# Patient Record
Sex: Female | Born: 1950 | Race: Black or African American | Hispanic: No | Marital: Single | Smoking: Current some day smoker
Health system: Southern US, Community
[De-identification: ages and names within clinical notes are randomized; demographics above are authoritative.]

## PROBLEM LIST (undated history)

## (undated) DIAGNOSIS — I10 Essential (primary) hypertension: Secondary | ICD-10-CM

## (undated) HISTORY — PX: APPENDECTOMY: SHX54

## (undated) HISTORY — PX: BREAST SURGERY: SHX581

---

## 2009-01-07 ENCOUNTER — Emergency Department (HOSPITAL_BASED_OUTPATIENT_CLINIC_OR_DEPARTMENT_OTHER): Admission: EM | Admit: 2009-01-07 | Discharge: 2009-01-07 | Payer: Self-pay | Admitting: Emergency Medicine

## 2009-04-12 ENCOUNTER — Ambulatory Visit: Payer: Self-pay | Admitting: Diagnostic Radiology

## 2009-04-12 ENCOUNTER — Emergency Department (HOSPITAL_BASED_OUTPATIENT_CLINIC_OR_DEPARTMENT_OTHER): Admission: EM | Admit: 2009-04-12 | Discharge: 2009-04-12 | Payer: Self-pay | Admitting: Emergency Medicine

## 2009-11-30 ENCOUNTER — Other Ambulatory Visit: Admission: RE | Admit: 2009-11-30 | Discharge: 2009-11-30 | Payer: Self-pay | Admitting: Obstetrics and Gynecology

## 2009-11-30 ENCOUNTER — Ambulatory Visit: Payer: Self-pay | Admitting: Obstetrics and Gynecology

## 2010-04-17 ENCOUNTER — Ambulatory Visit: Payer: Self-pay | Admitting: Diagnostic Radiology

## 2010-04-17 ENCOUNTER — Emergency Department (HOSPITAL_BASED_OUTPATIENT_CLINIC_OR_DEPARTMENT_OTHER): Admission: EM | Admit: 2010-04-17 | Discharge: 2010-04-17 | Payer: Self-pay | Admitting: Emergency Medicine

## 2010-09-28 LAB — DIFFERENTIAL
Eosinophils Absolute: 0.1 10*3/uL (ref 0.0–0.7)
Eosinophils Relative: 3 % (ref 0–5)
Lymphs Abs: 1.8 10*3/uL (ref 0.7–4.0)
Monocytes Relative: 10 % (ref 3–12)

## 2010-09-28 LAB — URINALYSIS, ROUTINE W REFLEX MICROSCOPIC
Glucose, UA: NEGATIVE mg/dL
Leukocytes, UA: NEGATIVE
Nitrite: NEGATIVE
Protein, ur: NEGATIVE mg/dL
pH: 6 (ref 5.0–8.0)

## 2010-09-28 LAB — BASIC METABOLIC PANEL
CO2: 28 mEq/L (ref 19–32)
Chloride: 105 mEq/L (ref 96–112)
GFR calc Af Amer: >60 mL/min (ref >60)
Potassium: 3.8 mEq/L (ref 3.5–5.1)
Sodium: 142 mEq/L (ref 135–145)

## 2010-09-28 LAB — CBC
Hemoglobin: 13.4 g/dL (ref 12.0–15.0)
MCH: 29.5 pg (ref 26.0–34.0)
MCV: 85.2 fL (ref 78.0–100.0)
RBC: 4.56 MIL/uL (ref 3.87–5.11)

## 2010-09-28 LAB — URINE CULTURE: Culture: NO GROWTH

## 2010-09-28 LAB — POCT CARDIAC MARKERS
Myoglobin, poc: 52.9 ng/mL (ref 12–200)
Troponin i, poc: <0.05 ng/mL (ref 0.00–0.09)

## 2010-09-28 LAB — URINE MICROSCOPIC-ADD ON

## 2010-10-20 LAB — GLUCOSE, CAPILLARY: Glucose-Capillary: 177 mg/dL — ABNORMAL HIGH (ref 70–99)

## 2010-11-08 ENCOUNTER — Emergency Department (HOSPITAL_BASED_OUTPATIENT_CLINIC_OR_DEPARTMENT_OTHER)
Admission: EM | Admit: 2010-11-08 | Discharge: 2010-11-08 | Disposition: A | Discharge status: Home / Self Care | Payer: Self-pay | Attending: Emergency Medicine | Admitting: Emergency Medicine

## 2010-11-08 DIAGNOSIS — I10 Essential (primary) hypertension: Secondary | ICD-10-CM | POA: Insufficient documentation

## 2010-11-08 DIAGNOSIS — R42 Dizziness and giddiness: Secondary | ICD-10-CM | POA: Insufficient documentation

## 2010-11-08 DIAGNOSIS — E119 Type 2 diabetes mellitus without complications: Secondary | ICD-10-CM | POA: Insufficient documentation

## 2010-11-08 DIAGNOSIS — Z853 Personal history of malignant neoplasm of breast: Secondary | ICD-10-CM | POA: Insufficient documentation

## 2010-11-08 DIAGNOSIS — F17200 Nicotine dependence, unspecified, uncomplicated: Secondary | ICD-10-CM | POA: Insufficient documentation

## 2010-11-08 LAB — BASIC METABOLIC PANEL
Chloride: 106 mEq/L (ref 96–112)
GFR calc Af Amer: >60 mL/min (ref >60)
GFR calc non Af Amer: >60 mL/min (ref >60)
Potassium: 3.9 mEq/L (ref 3.5–5.1)
Sodium: 142 mEq/L (ref 135–145)

## 2010-11-08 LAB — CBC
MCV: 83.1 fL (ref 78.0–100.0)
Platelets: 169 10*3/uL (ref 150–400)
RBC: 4.37 MIL/uL (ref 3.87–5.11)
WBC: 6.5 10*3/uL (ref 4.0–10.5)

## 2011-05-27 ENCOUNTER — Emergency Department (HOSPITAL_BASED_OUTPATIENT_CLINIC_OR_DEPARTMENT_OTHER)
Admission: EM | Admit: 2011-05-27 | Discharge: 2011-05-27 | Disposition: A | Discharge status: Home / Self Care | Payer: Self-pay | Attending: Emergency Medicine | Admitting: Emergency Medicine

## 2011-05-27 ENCOUNTER — Encounter: Payer: Self-pay | Admitting: Emergency Medicine

## 2011-05-27 ENCOUNTER — Emergency Department (INDEPENDENT_AMBULATORY_CARE_PROVIDER_SITE_OTHER): Payer: Self-pay

## 2011-05-27 DIAGNOSIS — I10 Essential (primary) hypertension: Secondary | ICD-10-CM | POA: Insufficient documentation

## 2011-05-27 DIAGNOSIS — K047 Periapical abscess without sinus: Secondary | ICD-10-CM | POA: Insufficient documentation

## 2011-05-27 DIAGNOSIS — Z79899 Other long term (current) drug therapy: Secondary | ICD-10-CM | POA: Insufficient documentation

## 2011-05-27 DIAGNOSIS — J3489 Other specified disorders of nose and nasal sinuses: Secondary | ICD-10-CM

## 2011-05-27 DIAGNOSIS — K089 Disorder of teeth and supporting structures, unspecified: Secondary | ICD-10-CM | POA: Insufficient documentation

## 2011-05-27 HISTORY — DX: Essential (primary) hypertension: I10

## 2011-05-27 LAB — URINALYSIS, ROUTINE W REFLEX MICROSCOPIC
Glucose, UA: 250 mg/dL — AB
Ketones, ur: NEGATIVE mg/dL
Leukocytes, UA: NEGATIVE
Protein, ur: NEGATIVE mg/dL
pH: 6.5 (ref 5.0–8.0)

## 2011-05-27 LAB — URINE MICROSCOPIC-ADD ON

## 2011-05-27 LAB — CBC
HCT: 41.9 % (ref 36.0–46.0)
Hemoglobin: 14 g/dL (ref 12.0–15.0)
MCV: 83.5 fL (ref 78.0–100.0)
RBC: 5.02 MIL/uL (ref 3.87–5.11)
WBC: 6.7 10*3/uL (ref 4.0–10.5)

## 2011-05-27 LAB — DIFFERENTIAL
Eosinophils Relative: 2 % (ref 0–5)
Lymphocytes Relative: 27 % (ref 12–46)
Lymphs Abs: 1.8 10*3/uL (ref 0.7–4.0)
Monocytes Absolute: 1 10*3/uL (ref 0.1–1.0)
Neutro Abs: 3.8 10*3/uL (ref 1.7–7.7)

## 2011-05-27 LAB — BASIC METABOLIC PANEL
CO2: 30 mEq/L (ref 19–32)
Calcium: 10.1 mg/dL (ref 8.4–10.5)
Chloride: 98 mEq/L (ref 96–112)
Creatinine, Ser: 0.7 mg/dL (ref 0.50–1.10)
Glucose, Bld: 168 mg/dL — ABNORMAL HIGH (ref 70–99)

## 2011-05-27 MED ORDER — LIDOCAINE-EPINEPHRINE 2 %-1:100000 IJ SOLN
INTRAMUSCULAR | Status: AC
  Filled 2011-05-27: qty 1

## 2011-05-27 MED ORDER — PENICILLIN V POTASSIUM 250 MG PO TABS: 500.0000 mg | ORAL_TABLET | Freq: Four times a day (QID) | ORAL | Status: AC

## 2011-05-27 MED ORDER — CLINDAMYCIN PHOSPHATE 600 MG/50ML IV SOLN
600.0000 mg | Freq: Once | INTRAVENOUS | Status: AC
  Administered 2011-05-27: 600 mg via INTRAVENOUS
  Filled 2011-05-27: qty 50

## 2011-05-27 MED ORDER — MORPHINE SULFATE 4 MG/ML IJ SOLN
4.0000 mg | Freq: Once | INTRAMUSCULAR | Status: AC
  Administered 2011-05-27: 4 mg via INTRAVENOUS
  Filled 2011-05-27: qty 1

## 2011-05-27 MED ORDER — IOHEXOL 350 MG/ML SOLN
100.0000 mL | Freq: Once | INTRAVENOUS | Status: AC | PRN
  Administered 2011-05-27: 100 mL via INTRAVENOUS

## 2011-05-27 MED ORDER — HYDROCODONE-ACETAMINOPHEN 5-500 MG PO TABS: 1.0000 | ORAL_TABLET | Freq: Four times a day (QID) | ORAL | Status: AC | PRN

## 2011-05-27 MED ORDER — LIDOCAINE HCL (PF) 1 % IJ SOLN
5.0000 mL | Freq: Once | INTRAMUSCULAR | Status: DC
  Filled 2011-05-27: qty 5

## 2011-05-27 MED ORDER — LIDOCAINE-EPINEPHRINE 2 %-1:100000 IJ SOLN: 5.0000 mL | Freq: Once | INTRAMUSCULAR | Status: DC

## 2011-05-27 MED ORDER — SODIUM CHLORIDE 0.9 % IV BOLUS (SEPSIS)
500.0000 mL | Freq: Once | INTRAVENOUS | Status: AC
  Administered 2011-05-27: 1000 mL via INTRAVENOUS

## 2011-05-27 NOTE — ED Notes (Signed)
Care plan and safe use of Meds reviewed with Pt and family

## 2011-05-27 NOTE — ED Notes (Signed)
Patient is resting comfortably.Pain decreased with pain meds infusion in progress care plan reviewed

## 2011-05-27 NOTE — ED Provider Notes (Signed)
History     CSN: [id number] Arrival date & time: 05/27/2011  9:52 AM   First MD Initiated Contact with Patient 05/27/11 [phone]      Chief Complaint  Patient presents with  . Dental Pain    facial swelling R lower jaw     HPI  60 year old female history of non-insulin-dependent diabetes, hypertension presents with right jaw swelling. Patient states that yesterday morning she began to have a right lower jaw tooth ache and now has progressive redness and swelling to the area. She denies fever, chills. She denies neck pain, neck stiffness. She denies throat pain, shortness of breath. She does not feel like her neck is swollen. States that she's never had similar in the past. States she's been having difficulty eating because of the pain and has not checked her glucose. She denies any other complaints today.    Past Medical History  Diagnosis Date  . Diabetes mellitus   . Hypertension     Past Surgical History  Procedure Date  . Breast surgery     History reviewed. No pertinent family history.  History  Substance Use Topics  . Smoking status: Current Some Day Smoker  . Smokeless tobacco: Not on file  . Alcohol Use: No    OB History    Grav Para Term Preterm Abortions TAB SAB Ect Mult Living                  Review of Systems  All other systems reviewed and are negative.   Negative except as noted history of present illness   Allergies  Sulfa antibiotics  Home Medications   Current Outpatient Rx  Name Route Sig Dispense Refill  . METFORMIN HCL 1000 MG PO TABS Oral Take 1,000 mg by mouth 2 (two) times daily with a meal.      . OLMESARTAN MEDOXOMIL 20 MG PO TABS Oral Take 20 mg by mouth daily.      Marland Kitchen HYDROCODONE-ACETAMINOPHEN 5-500 MG PO TABS Oral Take 1-2 tablets by mouth every 6 (six) hours as needed for pain. 15 tablet 0  . PENICILLIN V POTASSIUM 250 MG PO TABS Oral Take 2 tablets (500 mg total) by mouth 4 (four) times daily. 80 tablet 0    BP 105/45   Pulse 80  Temp(Src) 98.5 F (36.9 C) (Oral)  Resp 22  Ht 5\' 4"  (1.626 m)  Wt 168 lb (76.204 kg)  BMI 28.84 kg/m2  SpO2 98%  Physical Exam  Nursing note and vitals reviewed. Constitutional: She is oriented to person, place, and time. She appears well-developed.  HENT:  Head: Atraumatic.  Mouth/Throat: Oropharynx is clear and moist.       Right lower premolars with diffuse tenderness to percussion There is a 1 x 2 cm bulge in the gingiva underlying right premolars with fluctuance  There is swelling of the right jaw without fluctuance or induration  Eyes: Conjunctivae and EOM are normal. Pupils are equal, round, and reactive to light.  Neck: Normal range of motion. Neck supple.  Cardiovascular: Normal rate, regular rhythm, normal heart sounds and intact distal pulses.   Pulmonary/Chest: Effort normal and breath sounds normal. No respiratory distress. She has no wheezes. She has no rales.  Abdominal: Soft. She exhibits no distension. There is no tenderness. There is no rebound and no guarding.  Musculoskeletal: Normal range of motion.  Neurological: She is alert and oriented to person, place, and time.  Skin: Skin is warm and dry. No rash noted.  Psychiatric: She has a normal mood and affect.    ED Course  INCISION AND DRAINAGE Date/Time: 05/27/2011 1:52 PM Performed by: Forbes Cellar Authorized by: Forbes Cellar Consent: Verbal consent obtained. Consent given by: patient Patient understanding: patient states understanding of the procedure being performed Patient consent: the patient's understanding of the procedure matches consent given Procedure consent: procedure consent matches procedure scheduled Relevant documents: relevant documents present and verified Site marked: the operative site was marked Patient identity confirmed: verbally with patient and arm band Time out: Immediately prior to procedure a "time out" was called to verify the correct patient, procedure,  equipment, support staff and site/side marked as required. Type: abscess Body area: mouth Anesthesia: local infiltration Local anesthetic: lidocaine 2% with epinephrine Anesthetic total: 1 ml Patient sedated: no Scalpel size: 11 Incision type: single straight Complexity: simple Drainage: purulent Drainage amount: moderate Wound treatment: wound left open Patient tolerance: Patient tolerated the procedure well with no immediate complications.   (including critical care time)  Labs Reviewed  DIFFERENTIAL - Abnormal; Notable for the following:    Monocytes Relative 15 (*)    All other components within normal limits  BASIC METABOLIC PANEL - Abnormal; Notable for the following:    Glucose, Bld 168 (*)    All other components within normal limits  URINALYSIS, ROUTINE W REFLEX MICROSCOPIC - Abnormal; Notable for the following:    Glucose, UA 250 (*)    Hgb urine dipstick SMALL (*)    Urobilinogen, UA 2.0 (*)    All other components within normal limits  URINE MICROSCOPIC-ADD ON - Abnormal; Notable for the following:    Squamous Epithelial / LPF FEW (*)    Bacteria, UA MANY (*)    All other components within normal limits  CBC  WOUND CULTURE   Ct Maxillofacial W/cm  05/27/2011  *RADIOLOGY REPORT*  Clinical Data: Right dental abscess with possible extension to submandibular region  CT MAXILLOFACIAL WITH CONTRAST  Technique:  Multidetector CT imaging of the maxillofacial structures was performed with intravenous contrast. Multiplanar CT image reconstructions were also generated.  Contrast: OMNIPAQUE IOHEXOL 350 MG/ML IV SOLN  Comparison: Head CT - 04/17/2010  Findings:  There is an approximately 1.3 x 0.8 x 1.0 cm peripherally enhancing fluid collection/abscess along the buccal aspect of the right 1st molar. There is lucency surrounding the root of the right 1st molar with osteolysis (image 24, series 2).  Soft tissue stranding and thickening extends inferiorly along the right  mandible, however there is no discernible fluid collection within this location.  No facial fracture.  Incidental note is made of hyperostosis frontalis.  Mucosal thickening within the paranasal sinuses. Possible mucous retention cyst in the right maxillary sinus.  No air fluid levels. Mastoid air cells are normally aerated. The bilateral orbits appear normal.  Limited post contrast evaluation of the intracranial structures is normal.  No discrete hyperenhancing mass.  Normal size and configuration of the ventricles and basilar cisterns.  No midline shift.  Limited visualization of the cervical spine demonstrates mild DDD of C5 - C6. Limited visualization of the thyroid is normal. Cervical vasculature is patent.  Shoddy right-sided cervical lymph nodes are not enlarged by CT criteria, likely reactive in etiology.  IMPRESSION:  1.  Approximately 1.3 cm peridental abscess adjacent to the buccal aspect of the right first molar.  2.  Soft tissue stranding thickening extends along the inferior aspect of the right side of the mandible, however is without discernible additional fluid collection/abscess.  3. Paranasal sinus disease as above.  No air fluid levels.  Original Report Authenticated By: Waynard Reeds, M.D.     1. Dental abscess       MDM  Abscess status post drainage. The patient is diabetic. Her last been reviewed her glucose is only slightly elevated today. She's been given IV clindamycin, IV fluids, and pain medication. She finished her precautions for return. Home with penicillin VK and Vicodin. The patient does not have insurance otherwise I would have prescribed clindamycin. She is to come back to the emergency department for recheck in 2 days as I do not believe she will be able to followup. Sooner if needed. She has been given resources for primary care as well as dental followup.   Stefano Gaul, MD        Forbes Cellar, MD 05/27/11 [phone]

## 2011-05-29 ENCOUNTER — Encounter (HOSPITAL_BASED_OUTPATIENT_CLINIC_OR_DEPARTMENT_OTHER): Payer: Self-pay | Admitting: *Deleted

## 2011-05-29 ENCOUNTER — Emergency Department (HOSPITAL_BASED_OUTPATIENT_CLINIC_OR_DEPARTMENT_OTHER)
Admission: EM | Admit: 2011-05-29 | Discharge: 2011-05-29 | Disposition: A | Discharge status: Home / Self Care | Payer: Self-pay | Attending: Emergency Medicine | Admitting: Emergency Medicine

## 2011-05-29 DIAGNOSIS — E119 Type 2 diabetes mellitus without complications: Secondary | ICD-10-CM | POA: Insufficient documentation

## 2011-05-29 DIAGNOSIS — F17200 Nicotine dependence, unspecified, uncomplicated: Secondary | ICD-10-CM | POA: Insufficient documentation

## 2011-05-29 DIAGNOSIS — K0889 Other specified disorders of teeth and supporting structures: Secondary | ICD-10-CM

## 2011-05-29 DIAGNOSIS — I10 Essential (primary) hypertension: Secondary | ICD-10-CM | POA: Insufficient documentation

## 2011-05-29 DIAGNOSIS — K089 Disorder of teeth and supporting structures, unspecified: Secondary | ICD-10-CM | POA: Insufficient documentation

## 2011-05-29 MED ORDER — CLINDAMYCIN HCL 150 MG PO CAPS: 150.0000 mg | ORAL_CAPSULE | Freq: Three times a day (TID) | ORAL | Status: AC

## 2011-05-29 NOTE — ED Provider Notes (Signed)
Medical screening examination/treatment/procedure(s) were performed by non-physician practitioner and as supervising physician I was immediately available for consultation/collaboration.  Kenyia Wambolt K Kalimah Capurro-Rasch, MD 05/29/11 1435 

## 2011-05-29 NOTE — ED Notes (Signed)
Was here 2 days ago with dental abscess. Was told to come back today for a recheck. She states she feels better. Swelling has gone away. Was only able to take the PCN the first day. Stopped taking it due to nausea.

## 2011-05-29 NOTE — ED Provider Notes (Signed)
History     CSN: [id number] Arrival date & time: 05/29/2011 12:20 PM   First MD Initiated Contact with Patient 05/29/11 1222      Chief Complaint  Patient presents with  . Dental Pain    (Consider location/radiation/quality/duration/timing/severity/associated sxs/prior treatment) HPI Comments: Pt states that she was seen 2 days ago and she was told to come back in to rechecked:pt states that she had a abscess drained and that she can't take penicillin because it makes her vomit  Patient is a 60 y.o. female presenting with tooth pain. The history is provided by the patient. No language interpreter was used.  Dental PainPrimary symptoms do not include fever. The symptoms began 3 to 5 days ago.    Past Medical History  Diagnosis Date  . Diabetes mellitus   . Hypertension     Past Surgical History  Procedure Date  . Breast surgery     No family history on file.  History  Substance Use Topics  . Smoking status: Current Some Day Smoker  . Smokeless tobacco: Not on file  . Alcohol Use: No    OB History    Grav Para Term Preterm Abortions TAB SAB Ect Mult Living                  Review of Systems  Constitutional: Negative.  Negative for fever.  Eyes: Negative.   Respiratory: Negative.   Cardiovascular: Negative.     Allergies  Sulfa antibiotics  Home Medications   Current Outpatient Rx  Name Route Sig Dispense Refill  . CLINDAMYCIN HCL 150 MG PO CAPS Oral Take 1 capsule (150 mg total) by mouth 3 (three) times daily. 21 capsule 0  . HYDROCODONE-ACETAMINOPHEN 5-500 MG PO TABS Oral Take 1-2 tablets by mouth every 6 (six) hours as needed for pain. 15 tablet 0  . METFORMIN HCL 1000 MG PO TABS Oral Take 1,000 mg by mouth 2 (two) times daily with a meal.      . OLMESARTAN MEDOXOMIL 20 MG PO TABS Oral Take 20 mg by mouth daily.      Marland Kitchen PENICILLIN V POTASSIUM 250 MG PO TABS Oral Take 2 tablets (500 mg total) by mouth 4 (four) times daily. 80 tablet 0    BP 127/45   Pulse 87  Temp(Src) 98.1 F (36.7 C) (Oral)  Resp 20  SpO2 9%  Physical Exam  Nursing note and vitals reviewed. Constitutional: She appears well-developed and well-nourished.  HENT:  Right Ear: External ear normal.  Left Ear: External ear normal.       Mild swelling noted to the right mouth:no abscess noted  Cardiovascular: Normal rate and regular rhythm.   Pulmonary/Chest: Effort normal and breath sounds normal.    ED Course  Procedures (including critical care time)  Labs Reviewed - No data to display No results found.   1. Toothache       MDM  Switched antibiotics;swelling decreasing        Teressa Lower, NP 05/29/11 1242

## 2011-05-30 LAB — WOUND CULTURE

## 2012-12-29 ENCOUNTER — Encounter (HOSPITAL_BASED_OUTPATIENT_CLINIC_OR_DEPARTMENT_OTHER): Payer: Self-pay

## 2012-12-29 ENCOUNTER — Emergency Department (HOSPITAL_BASED_OUTPATIENT_CLINIC_OR_DEPARTMENT_OTHER)
Admission: EM | Admit: 2012-12-29 | Discharge: 2012-12-29 | Disposition: A | Discharge status: Home / Self Care | Payer: Self-pay | Attending: Emergency Medicine | Admitting: Emergency Medicine

## 2012-12-29 DIAGNOSIS — I10 Essential (primary) hypertension: Secondary | ICD-10-CM | POA: Insufficient documentation

## 2012-12-29 DIAGNOSIS — Z79899 Other long term (current) drug therapy: Secondary | ICD-10-CM | POA: Insufficient documentation

## 2012-12-29 DIAGNOSIS — E119 Type 2 diabetes mellitus without complications: Secondary | ICD-10-CM | POA: Insufficient documentation

## 2012-12-29 DIAGNOSIS — M25562 Pain in left knee: Secondary | ICD-10-CM

## 2012-12-29 DIAGNOSIS — M25579 Pain in unspecified ankle and joints of unspecified foot: Secondary | ICD-10-CM | POA: Insufficient documentation

## 2012-12-29 DIAGNOSIS — F17200 Nicotine dependence, unspecified, uncomplicated: Secondary | ICD-10-CM | POA: Insufficient documentation

## 2012-12-29 MED ORDER — HYDROCODONE-ACETAMINOPHEN 5-325 MG PO TABS: 1.0000 | ORAL_TABLET | Freq: Four times a day (QID) | ORAL | Status: AC | PRN

## 2012-12-29 MED ORDER — KETOROLAC TROMETHAMINE 30 MG/ML IJ SOLN
30.0000 mg | Freq: Once | INTRAMUSCULAR | Status: AC
  Administered 2012-12-29: 30 mg via INTRAMUSCULAR
  Filled 2012-12-29: qty 1

## 2012-12-29 MED ORDER — NAPROXEN SODIUM 275 MG PO TABS: ORAL_TABLET | ORAL | Status: AC

## 2012-12-29 NOTE — ED Provider Notes (Signed)
History     CSN: [id number]  Arrival date & time 12/29/12  0506   First MD Initiated Contact with Patient 12/29/12 [phone]      Chief Complaint  Patient presents with  . Knee Pain    (Consider location/radiation/quality/duration/timing/severity/associated sxs/prior treatment) HPI This is a 62 year old female with a one-week history of pain in her left patellar ligament. The pain has gradually worsened. It got so severe this morning she could not sleep. She denies any injury to the left knee. There is no deformity or swelling associated with it. The pain is worse with any movement of the left knee. She has no history of gout. She has not been taking any medications for this. She had a remote history of cortisone injection in this same knee.  Past Medical History  Diagnosis Date  . Diabetes mellitus   . Hypertension     Past Surgical History  Procedure Laterality Date  . Breast surgery      No family history on file.  History  Substance Use Topics  . Smoking status: Current Some Day Smoker  . Smokeless tobacco: Not on file  . Alcohol Use: No    OB History   Grav Para Term Preterm Abortions TAB SAB Ect Mult Living                  Review of Systems  All other systems reviewed and are negative.    Allergies  Sulfa antibiotics  Home Medications   Current Outpatient Rx  Name  Route  Sig  Dispense  Refill  . metFORMIN (GLUCOPHAGE) 1000 MG tablet   Oral   Take 1,000 mg by mouth 2 (two) times daily with a meal.           . olmesartan (BENICAR) 20 MG tablet   Oral   Take 20 mg by mouth daily.             BP 123/58  Pulse 66  Temp(Src) 98.4 F (36.9 C) (Oral)  Resp 18  SpO2 99%  Physical Exam General: Well-developed, well-nourished female in no acute distress; appearance consistent with age of record HENT: normocephalic, atraumatic Eyes: pupils equal round and reactive to light; extraocular muscles intact Neck: supple Heart: regular rate and  rhythm Lungs: clear to auscultation bilaterally Abdomen: soft; nondistended Extremities: No deformity; tenderness in the patellar ligament movement of the left knee without swelling, effusion, erythema or warmth; left knee stable Neurologic: Awake, alert and oriented; motor function intact in all extremities and symmetric; no facial droop Skin: Warm and dry Psychiatric: Normal mood and affect    ED Course  Procedures (including critical care time)     MDM  We will treat her pain and refer her to Dr. Pearletha Forge (sports medicine) upstairs.        Hanley Seamen, MD 12/29/12 [phone]

## 2012-12-29 NOTE — ED Notes (Signed)
Patient here with left knee pain x 1 week. Denies injury. Pain with ambulation or any change in position, no swelling noted

## 2012-12-29 NOTE — ED Notes (Signed)
MD at bedside. 

## 2013-05-02 IMAGING — CT CT MAXILLOFACIAL W/ CM
3 series · 15 of 47 positions shown, 18 images · IV contrast (omnipaque)
Comparison: Head CT - 04/17/2010

CLINICAL DATA: Right dental abscess with possible extension to
submandibular region

CT MAXILLOFACIAL WITH CONTRAST
TECHNIQUE: Multidetector CT imaging of the maxillofacial
structures was performed with intravenous contrast. Multiplanar CT
image reconstructions were also generated.
Contrast: 100mL OMNIPAQUE IOHEXOL 350 MG/ML IV SOLN

[Series 3: maxillofacial w 2.0 h30s st · axial · 0.37mm/px · z∈[+1118,+1268]mm · 9 of 88 slices shown, 12 images]
[im 7/88  brain]
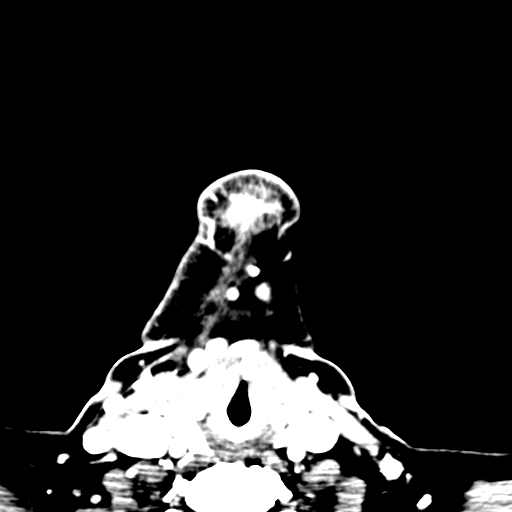
[im 7/88  bone]
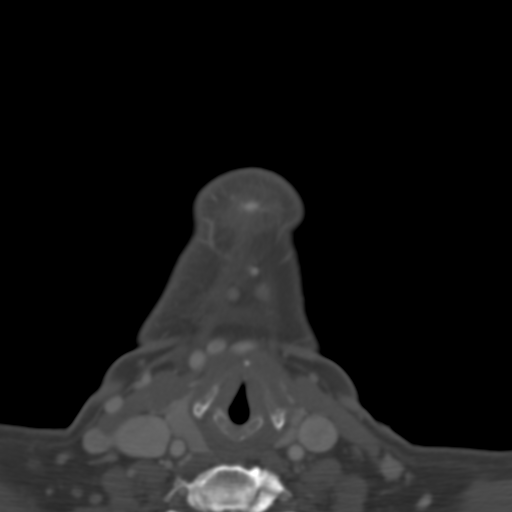
[im 16/88  bone]
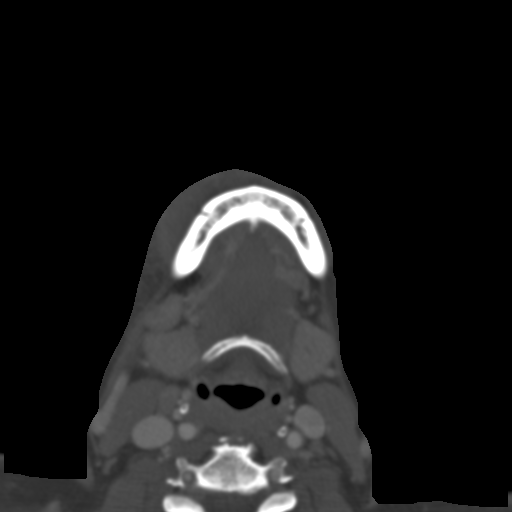
[im 25/88  bone]
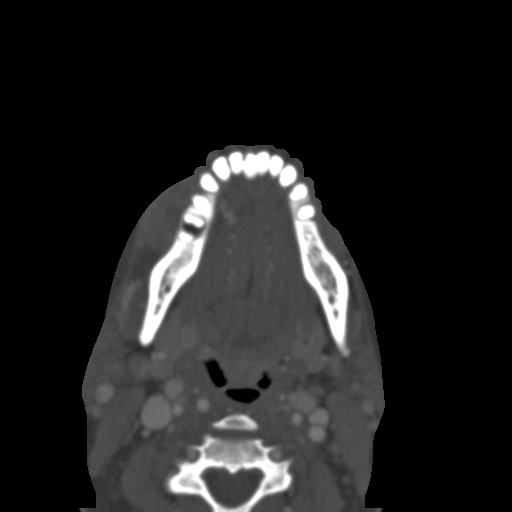
[im 34/88  bone]
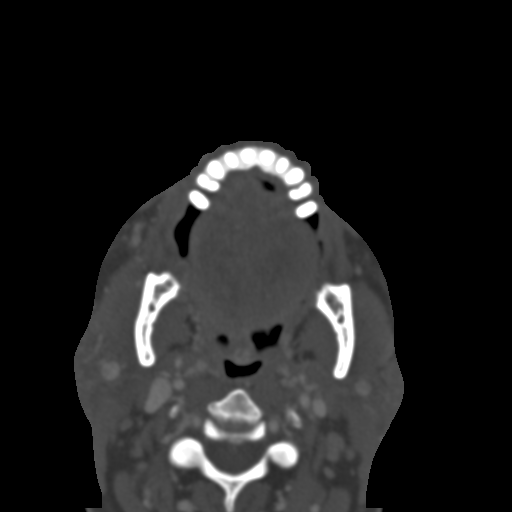
[im 46/88  brain]
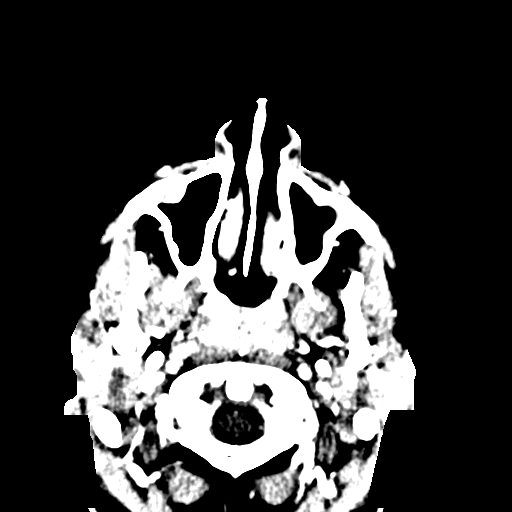
[im 46/88  bone]
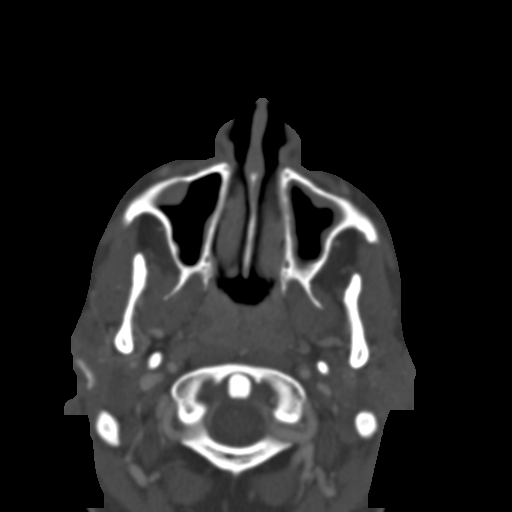
[im 55/88  bone]
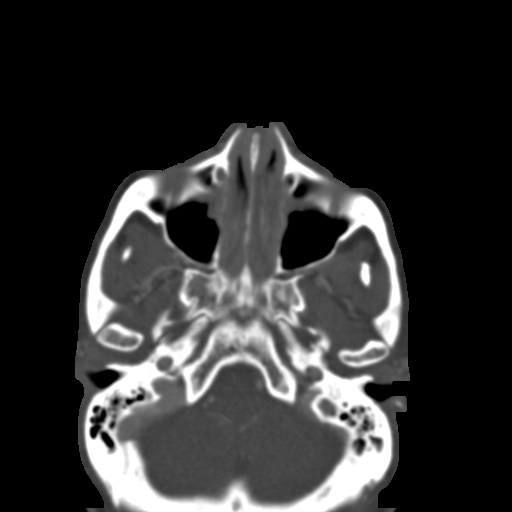
[im 64/88  bone]
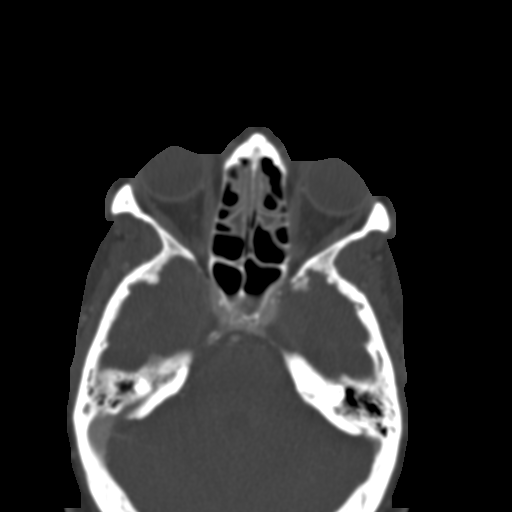
[im 73/88  bone]
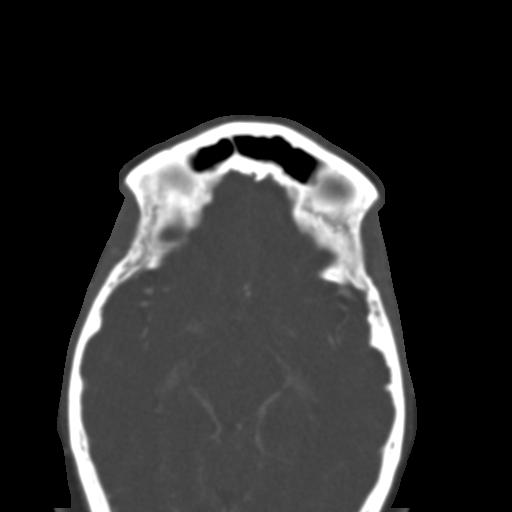
[im 82/88  brain]
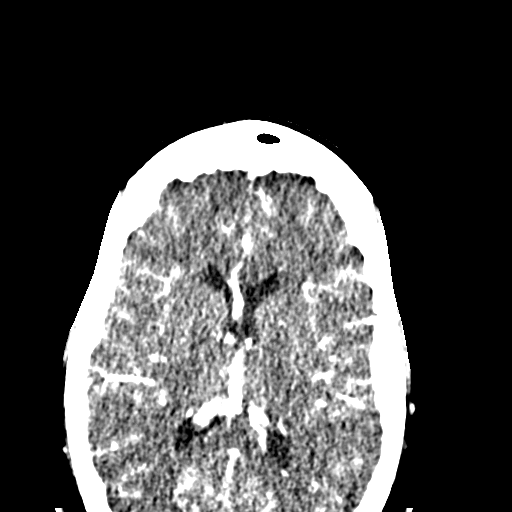
[im 82/88  bone]
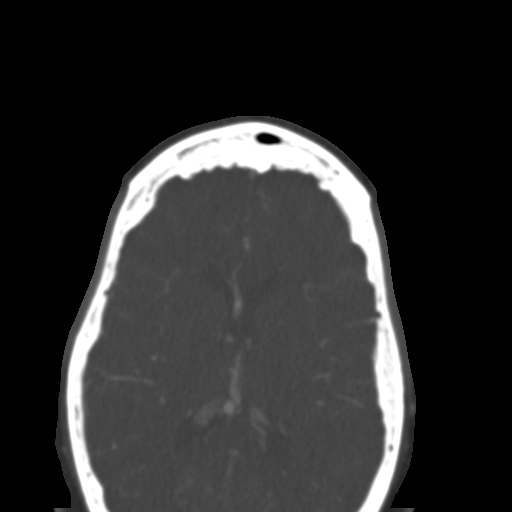

[Series 8: maxillofacial w 2.0 coronal · coronal · 0.45mm/px · 3 of 69 slices shown]
[im 23/69  bone]
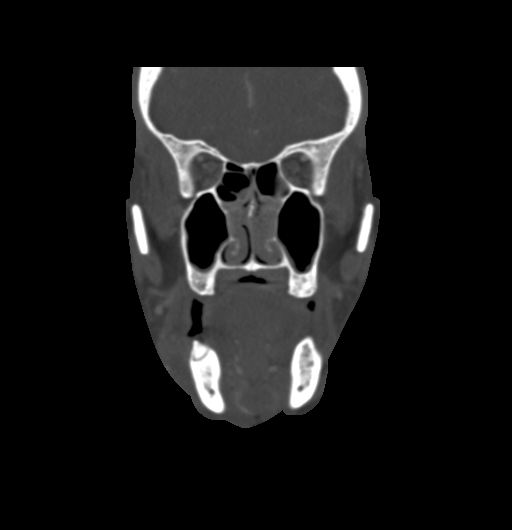
[im 31/69  bone]
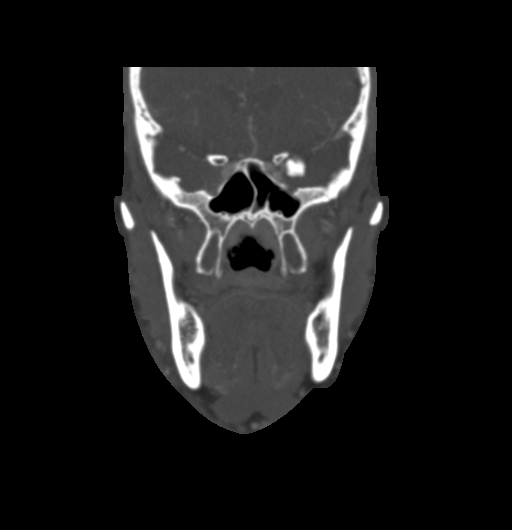
[im 38/69  bone]
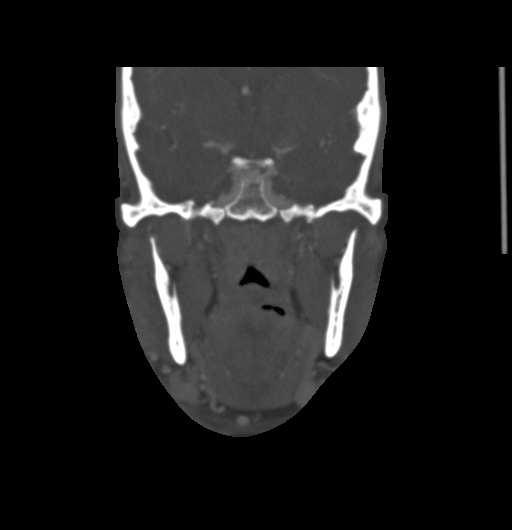

[Series 9: maxillofacial w 2.0 sagittal · sagittal · 0.45mm/px · 3 of 66 slices shown]
[im 22/66  bone]
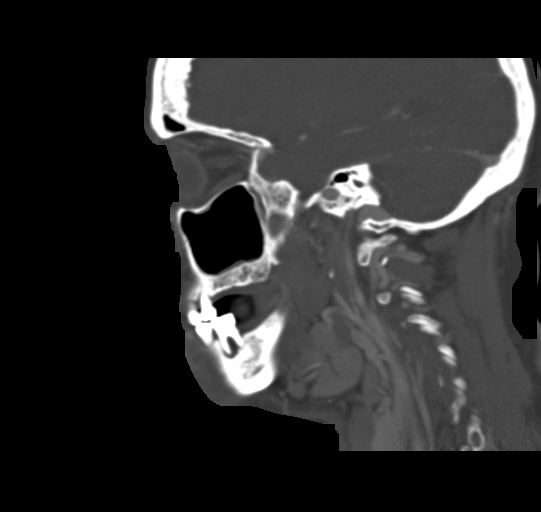
[im 33/66  bone]
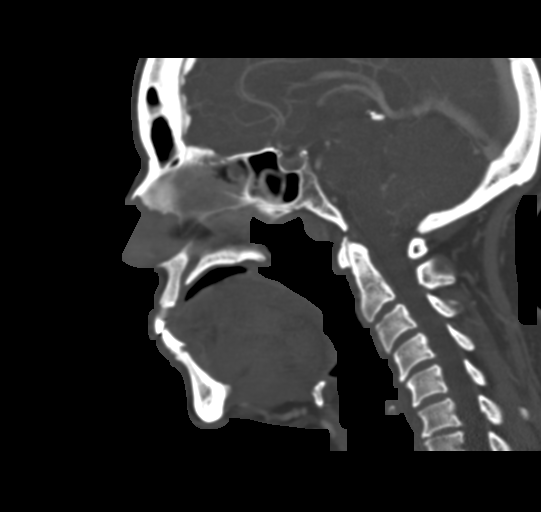
[im 44/66  bone]
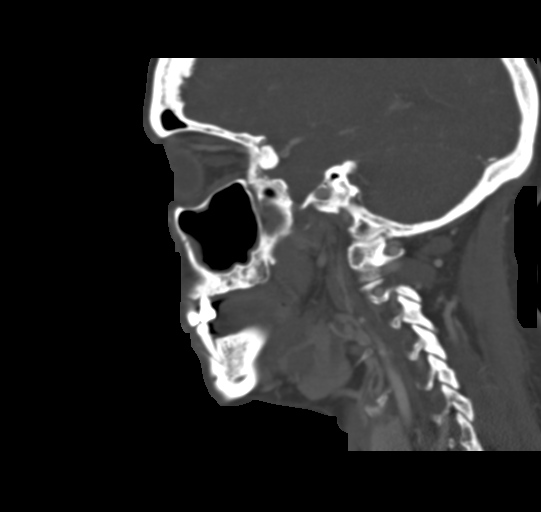

[15 of 47 positions shown; findings below may reference images not displayed]

FINDINGS: There is an approximately 1.3 x 0.8 x 1.0 cm peripherally enhancing
fluid collection/abscess along the buccal aspect of the right 1st
molar. There is lucency surrounding the root of the right 1st molar
with osteolysis (image 24, series 2).  Soft tissue stranding and
thickening extends inferiorly along the right mandible, however
there is no discernible fluid collection within this location.

No facial fracture.  Incidental note is made of hyperostosis
frontalis.  Mucosal thickening within the paranasal sinuses.
Possible mucous retention cyst in the right maxillary sinus.  No
air fluid levels. Mastoid air cells are normally aerated. The
bilateral orbits appear normal.

Limited post contrast evaluation of the intracranial structures is
normal.  No discrete hyperenhancing mass.  Normal size and
configuration of the ventricles and basilar cisterns.  No midline
shift.

Limited visualization of the cervical spine demonstrates mild DDD
of C5 - C6. Limited visualization of the thyroid is normal.
Cervical vasculature is patent.  Shoddy right-sided cervical lymph
nodes are not enlarged by CT criteria, likely reactive in etiology.
IMPRESSION: 1.  Approximately 1.3 cm peridental abscess adjacent to the buccal
aspect of the right first molar.

2.  Soft tissue stranding thickening extends along the inferior
aspect of the right side of the mandible, however is without
discernible additional fluid collection/abscess.

3. Paranasal sinus disease as above.  No air fluid levels.

## 2015-06-05 ENCOUNTER — Emergency Department (HOSPITAL_BASED_OUTPATIENT_CLINIC_OR_DEPARTMENT_OTHER)
Admission: EM | Admit: 2015-06-05 | Discharge: 2015-06-05 | Disposition: A | Discharge status: Home / Self Care | Payer: No Typology Code available for payment source | Attending: Emergency Medicine | Admitting: Emergency Medicine

## 2015-06-05 ENCOUNTER — Encounter (HOSPITAL_BASED_OUTPATIENT_CLINIC_OR_DEPARTMENT_OTHER): Payer: Self-pay | Admitting: Emergency Medicine

## 2015-06-05 DIAGNOSIS — E119 Type 2 diabetes mellitus without complications: Secondary | ICD-10-CM | POA: Insufficient documentation

## 2015-06-05 DIAGNOSIS — Y998 Other external cause status: Secondary | ICD-10-CM | POA: Insufficient documentation

## 2015-06-05 DIAGNOSIS — S161XXA Strain of muscle, fascia and tendon at neck level, initial encounter: Secondary | ICD-10-CM | POA: Insufficient documentation

## 2015-06-05 DIAGNOSIS — Z791 Long term (current) use of non-steroidal anti-inflammatories (NSAID): Secondary | ICD-10-CM | POA: Insufficient documentation

## 2015-06-05 DIAGNOSIS — F17200 Nicotine dependence, unspecified, uncomplicated: Secondary | ICD-10-CM | POA: Diagnosis not present

## 2015-06-05 DIAGNOSIS — Y92410 Unspecified street and highway as the place of occurrence of the external cause: Secondary | ICD-10-CM | POA: Diagnosis not present

## 2015-06-05 DIAGNOSIS — I10 Essential (primary) hypertension: Secondary | ICD-10-CM | POA: Insufficient documentation

## 2015-06-05 DIAGNOSIS — Y9389 Activity, other specified: Secondary | ICD-10-CM | POA: Insufficient documentation

## 2015-06-05 DIAGNOSIS — Z79899 Other long term (current) drug therapy: Secondary | ICD-10-CM | POA: Insufficient documentation

## 2015-06-05 DIAGNOSIS — S0990XA Unspecified injury of head, initial encounter: Secondary | ICD-10-CM | POA: Diagnosis present

## 2015-06-05 MED ORDER — METHOCARBAMOL 500 MG PO TABS: 500.0000 mg | ORAL_TABLET | Freq: Two times a day (BID) | ORAL | Status: AC
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
## 2015-06-05 NOTE — Discharge Instructions (Signed)
Return to the ED with any concerns including vomiting, seizure activity, weakness of arms or legs, changes in vision or speech, fainting, decreased level of alertness/lethargy, or any other alarming symptoms
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
## 2019-09-18 ENCOUNTER — Ambulatory Visit: Payer: Self-pay

## 2019-09-25 ENCOUNTER — Ambulatory Visit: Payer: Self-pay | Attending: Internal Medicine

## 2019-09-25 DIAGNOSIS — Z23 Encounter for immunization: Secondary | ICD-10-CM

## 2019-09-25 NOTE — Progress Notes (Signed)
   Covid-19 Vaccination Clinic  Name:  Pamela Duke    MRN: [id number] DOB: [birth year]  09/25/2019  Pamela Duke was observed post Covid-19 immunization for 15 minutes without incident. She was provided with Vaccine Information Sheet and instruction to access the V-Safe system.   Pamela Duke was instructed to call 911 with any severe reactions post vaccine: Marland Kitchen Difficulty breathing  . Swelling of face and throat  . A fast heartbeat  . A bad rash all over body  . Dizziness and weakness   Immunizations Administered    Name Date Dose VIS Date Route   Pfizer COVID-19 Vaccine 09/25/2019  8:16 AM 0.3 mL 06/26/2019 Intramuscular   Manufacturer: [employer], [employer]   Lot: [id number]   NDC: [address]-7

## 2019-10-20 ENCOUNTER — Ambulatory Visit: Payer: Self-pay | Attending: Internal Medicine

## 2019-10-20 DIAGNOSIS — Z23 Encounter for immunization: Secondary | ICD-10-CM

## 2019-10-20 NOTE — Progress Notes (Signed)
   Covid-19 Vaccination Clinic  Name:  Pamela Duke    MRN: [id number] DOB: [birth year]  10/20/2019  [name] was observed post Covid-19 immunization for 15 minutes without incident. She was provided with Vaccine Information Sheet and instruction to access the V-Safe system.   [name] was instructed to call 911 with any severe reactions post vaccine: Marland Kitchen Difficulty breathing  . Swelling of face and throat  . A fast heartbeat  . A bad rash all over body  . Dizziness and weakness   Immunizations Administered    Name Date Dose VIS Date Route   Pfizer COVID-19 Vaccine 10/20/2019  8:33 AM 0.3 mL 06/26/2019 Intramuscular   Manufacturer: [employer], [employer]   Lot: [id number]   NDC: [address]-8
